# Patient Record
Sex: Female | Born: 1980 | Race: Asian | Hispanic: No | Marital: Single | Smoking: Never smoker
Health system: Southern US, Community
[De-identification: ages and names within clinical notes are randomized; demographics above are authoritative.]

---

## 2014-03-11 ENCOUNTER — Ambulatory Visit (INDEPENDENT_AMBULATORY_CARE_PROVIDER_SITE_OTHER): Payer: Self-pay | Admitting: Physician Assistant

## 2014-03-11 VITALS — BP 108/64 | HR 72 | Temp 98.1°F | Resp 14 | Ht 64.0 in | Wt 159.0 lb

## 2014-03-11 DIAGNOSIS — Z30011 Encounter for initial prescription of contraceptive pills: Secondary | ICD-10-CM

## 2014-03-11 DIAGNOSIS — Z3009 Encounter for other general counseling and advice on contraception: Secondary | ICD-10-CM

## 2014-03-11 LAB — POCT URINE PREGNANCY: Preg Test, Ur: NEGATIVE

## 2014-03-11 MED ORDER — NORGESTIMATE-ETH ESTRADIOL 0.25-35 MG-MCG PO TABS: 1.0000 | ORAL_TABLET | Freq: Every day | ORAL | Status: DC

## 2014-03-11 NOTE — Progress Notes (Signed)
   Subjective:    Patient ID: Megan Simpson, female    DOB: Mar 13, 1981, 33 y.o.   MRN: [id number]  HPI 33 year old female presents for consult of starting OCP's. She is here today with her husband who is translating for her.  They are interested in contraception - do not plan to become pregnant in at least 1 year. She has no concerns about STI's. Has never had a pap test. She is healthy with no other concerns today.  LNMP 02/09/14   Review of Systems  Constitutional: Negative for fever and chills.  Gastrointestinal: Negative for nausea and vomiting.  Genitourinary: Negative for vaginal discharge and menstrual problem.  Neurological: Negative for headaches.       Objective:   Physical Exam  Constitutional: She is oriented to person, place, and time. She appears well-developed and well-nourished.  HENT:  Head: Normocephalic and atraumatic.  Right Ear: External ear normal.  Left Ear: External ear normal.  Eyes: Conjunctivae are normal.  Neck: Normal range of motion.  Cardiovascular: Normal rate.   Pulmonary/Chest: Effort normal.  Neurological: She is alert and oriented to person, place, and time.  Psychiatric: She has a normal mood and affect. Her behavior is normal. Judgment and thought content normal.    Results for orders placed in visit on 03/11/14  POCT URINE PREGNANCY      Result Value Ref Range   Preg Test, Ur Negative           Assessment & Plan:  Encounter for initial prescription of contraceptive pills - Plan: POCT urine pregnancy, norgestimate-ethinyl estradiol (ORTHO-CYCLEN,SPRINTEC,PREVIFEM) 0.25-35 MG-MCG tablet, norgestimate-ethinyl estradiol (ORTHO-CYCLEN,SPRINTEC,PREVIFEM) 0.25-35 MG-MCG tablet  Discussed with patient and husband who decided they would like to try OCP's. Will consider alternate methods (depo or Nexplanon) if unable to remember to take daily.  Patient declined pap today. Plan to schedule at 104 within the next 6 months.

## 2014-04-07 ENCOUNTER — Encounter: Payer: Self-pay | Admitting: Family Medicine

## 2014-04-22 NOTE — Progress Notes (Signed)
This encounter was created in error - please disregard.

## 2014-10-20 ENCOUNTER — Ambulatory Visit (INDEPENDENT_AMBULATORY_CARE_PROVIDER_SITE_OTHER): Payer: 59 | Admitting: Family Medicine

## 2014-10-20 VITALS — BP 130/80 | HR 97 | Temp 97.9°F | Resp 16 | Ht 64.5 in | Wt 159.0 lb

## 2014-10-20 DIAGNOSIS — R509 Fever, unspecified: Secondary | ICD-10-CM | POA: Diagnosis not present

## 2014-10-20 DIAGNOSIS — R52 Pain, unspecified: Secondary | ICD-10-CM | POA: Diagnosis not present

## 2014-10-20 DIAGNOSIS — R059 Cough, unspecified: Secondary | ICD-10-CM

## 2014-10-20 DIAGNOSIS — Z30011 Encounter for initial prescription of contraceptive pills: Secondary | ICD-10-CM

## 2014-10-20 DIAGNOSIS — R05 Cough: Secondary | ICD-10-CM

## 2014-10-20 DIAGNOSIS — J101 Influenza due to other identified influenza virus with other respiratory manifestations: Secondary | ICD-10-CM

## 2014-10-20 LAB — POCT CBC
Granulocyte percent: 67.5 %G (ref 37–80)
HCT, POC: 40.3 % (ref 37.7–47.9)
Hemoglobin: 12.7 g/dL (ref 12.2–16.2)
Lymph, poc: 1.9 (ref 0.6–3.4)
MCH: 28.6 pg (ref 27–31.2)
MCHC: 31.6 g/dL — AB (ref 31.8–35.4)
MCV: 90.6 fL (ref 80–97)
MID (cbc): 0.7 (ref 0–0.9)
MPV: 7.8 fL (ref 0–99.8)
POC Granulocyte: 5.5 (ref 2–6.9)
POC LYMPH PERCENT: 23.4 %L (ref 10–50)
POC MID %: 9.1 % (ref 0–12)
Platelet Count, POC: 276 10*3/uL (ref 142–424)
RBC: 4.45 M/uL (ref 4.04–5.48)
RDW, POC: 12.8 %
WBC: 8.1 10*3/uL (ref 4.6–10.2)

## 2014-10-20 LAB — POCT INFLUENZA A/B
Influenza A, POC: POSITIVE
Influenza B, POC: NEGATIVE

## 2014-10-20 MED ORDER — HYDROCODONE-HOMATROPINE 5-1.5 MG/5ML PO SYRP: 5.0000 mL | ORAL_SOLUTION | Freq: Three times a day (TID) | ORAL | Status: DC | PRN

## 2014-10-20 MED ORDER — NORGESTIMATE-ETH ESTRADIOL 0.25-35 MG-MCG PO TABS: 1.0000 | ORAL_TABLET | Freq: Every day | ORAL | Status: DC

## 2014-10-20 NOTE — Patient Instructions (Signed)
You have the flu- this is why you are feeling so badly.   Continue to use OTC tylenol or ibuprofen as needed for fever and aches The cough syrup will make you feel drowsy- do not take it when you need to drive Let me know if you do not feel better in the next few days

## 2014-10-20 NOTE — Progress Notes (Signed)
[facility] [address], [place] [id number] [phone]  Date:  10/20/2014   Name:  Kandis BanYen Vaca   DOB:  [birth year]   MRN:  [id number]  PCP:  No PCP Per Patient    Chief Complaint: Nasal Congestion; Dizziness; and Fever   History of Present Illness:  Kandis BanYen Canterbury is a 34 y.o. very pleasant female patient who presents with the following:  She is here today with fever,runny nose, dizzy.  This started yesterday.  She is generally in good health She also has a cough that started a few days prior to this illness She had diarrhea last night, she notes sore throat No vomiting  At home they have tried tylenol, dayquil; her last dose was this am She is generally in good health   She has her menses now.   There are no active problems to display for this patient.   History reviewed. No pertinent past medical history.  History reviewed. No pertinent past surgical history.  History  Substance Use Topics  . Smoking status: Never Smoker   . Smokeless tobacco: Not on file  . Alcohol Use: Not on file    History reviewed. No pertinent family history.  No Known Allergies  Medication list has been reviewed and updated.  Current Outpatient Prescriptions on File Prior to Visit  Medication Sig Dispense Refill  . norgestimate-ethinyl estradiol (ORTHO-CYCLEN,SPRINTEC,PREVIFEM) 0.25-35 MG-MCG tablet Take 1 tablet by mouth daily. 1 Package 5  . norgestimate-ethinyl estradiol (ORTHO-CYCLEN,SPRINTEC,PREVIFEM) 0.25-35 MG-MCG tablet Take 1 tablet by mouth daily. 1 Package 5   No current facility-administered medications on file prior to visit.    Review of Systems:  As per HPI- otherwise negative.   Physical Examination: Filed Vitals:   10/20/14 1542  BP: 130/80  Pulse: 97  Temp: 97.9 F (36.6 C)  Resp: 16   Filed Vitals:   10/20/14 1542  Height: 5' 4.5" (1.638 m)  Weight: 159 lb (72.122 kg)   Body mass index is 26.88 kg/(m^2). Ideal Body Weight: Weight in  (lb) to have BMI = 25: 147.6  GEN: WDWN, NAD, Non-toxic, A & O x 3, looks well, mild overweight HEENT: Atraumatic, Normocephalic. Neck supple. No masses, No LAD.  Bilateral TM wnl, oropharynx normal.  PEERL,EOMI.   Ears and Nose: No external deformity. CV: RRR, No M/G/R. No JVD. No thrill. No extra heart sounds. PULM: CTA B, no wheezes, crackles, rhonchi. No retractions. No resp. distress. No accessory muscle use. ABD: S, NT, ND, +BS. No rebound. No HSM.  Benign exam EXTR: No c/c/e NEURO Normal gait.  PSYCH: Normally interactive. Conversant. Not depressed or anxious appearing.  Calm demeanor.   Results for orders placed or performed in visit on 10/20/14  POCT CBC  Result Value Ref Range   WBC 8.1 4.6 - 10.2 K/uL   Lymph, poc 1.9 0.6 - 3.4   POC LYMPH PERCENT 23.4 10 - 50 %L   MID (cbc) 0.7 0 - 0.9   POC MID % 9.1 0 - 12 %M   POC Granulocyte 5.5 2 - 6.9   Granulocyte percent 67.5 37 - 80 %G   RBC 4.45 4.04 - 5.48 M/uL   Hemoglobin 12.7 12.2 - 16.2 g/dL   HCT, POC 29.540.3 62.137.7 - 47.9 %   MCV 90.6 80 - 97 fL   MCH, POC 28.6 27 - 31.2 pg   MCHC 31.6 (A) 31.8 - 35.4 g/dL   RDW, POC 30.812.8 %   Platelet Count, POC  276 142 - 424 K/uL   MPV 7.8 0 - 99.8 fL  POCT Influenza A/B  Result Value Ref Range   Influenza A, POC Positive    Influenza B, POC Negative      Assessment and Plan: Fever, unknown origin - Plan: POCT CBC, POCT Influenza A/B  Body aches - Plan: POCT CBC, POCT Influenza A/B  Cough - Plan: POCT CBC, POCT Influenza A/B, HYDROcodone-homatropine (HYCODAN) 5-1.5 MG/5ML syrup  Influenza A  Positive for flu a.  She has been sick for 4-5 days so too late for tamiflu.  Hycodan as needed, supportive measures. She will let us know if not better soon  Signed Abbe Amsterdam, MD

## 2014-11-12 ENCOUNTER — Ambulatory Visit (INDEPENDENT_AMBULATORY_CARE_PROVIDER_SITE_OTHER): Payer: 59 | Admitting: Family Medicine

## 2014-11-12 ENCOUNTER — Ambulatory Visit (INDEPENDENT_AMBULATORY_CARE_PROVIDER_SITE_OTHER): Payer: 59

## 2014-11-12 VITALS — BP 122/80 | HR 79 | Temp 97.9°F | Resp 18 | Ht 63.75 in | Wt 154.0 lb

## 2014-11-12 DIAGNOSIS — S91104A Unspecified open wound of right lesser toe(s) without damage to nail, initial encounter: Secondary | ICD-10-CM | POA: Diagnosis not present

## 2014-11-12 DIAGNOSIS — Z23 Encounter for immunization: Secondary | ICD-10-CM

## 2014-11-12 MED ORDER — CEPHALEXIN 500 MG PO CAPS: 500.0000 mg | ORAL_CAPSULE | Freq: Three times a day (TID) | ORAL | Status: DC

## 2014-11-12 NOTE — Patient Instructions (Signed)
Keep the wound clean with a Band-Aid on it. Wash it at least once or twice every day.  Take the cephalexin antibiotic one pill 3 times daily in the morning, afternoon, and evening.  Return if it ever is looking worse with more redness or a red line up your foot.  If not much better in 7-10 days come back for a recheck.

## 2014-11-12 NOTE — Progress Notes (Signed)
Subjective: Patient was shaving her feet over a week ago and cut her right fifth toe. They had some amoxicillin from [place] that she has been taking. She has been keeping a Band-Aid on it. She did not see a physician at that time. She has not had an up-to-date tetanus shot in over a decade. Her last menstrual cycle was a couple weeks ago, uses birth control.  Objective: Right fifth toe distal joint has a 1 cm open wound with a thickened border around it. It is partially healed but has quite mucus down in the ulcer cavity. There is no erythema or ascending erythema.  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 Alwyn RenHopper Normal xray.  No bony destruction.  Doubt osteomyelitis. I believe this is just a ulcer created by the wound. Will treat with cephalexin. Recheck if not much better over the next week.

## 2014-11-15 LAB — WOUND CULTURE
GRAM STAIN: NONE SEEN
Gram Stain: NONE SEEN
Gram Stain: NONE SEEN
Organism ID, Bacteria: NO GROWTH

## 2015-02-09 ENCOUNTER — Ambulatory Visit (INDEPENDENT_AMBULATORY_CARE_PROVIDER_SITE_OTHER): Payer: 59 | Admitting: Family Medicine

## 2015-02-09 ENCOUNTER — Ambulatory Visit (INDEPENDENT_AMBULATORY_CARE_PROVIDER_SITE_OTHER): Payer: 59

## 2015-02-09 VITALS — BP 112/80 | HR 81 | Temp 98.3°F | Resp 15 | Ht 63.75 in | Wt 154.0 lb

## 2015-02-09 DIAGNOSIS — Z30011 Encounter for initial prescription of contraceptive pills: Secondary | ICD-10-CM | POA: Diagnosis not present

## 2015-02-09 DIAGNOSIS — M25512 Pain in left shoulder: Secondary | ICD-10-CM

## 2015-02-09 DIAGNOSIS — S46812A Strain of other muscles, fascia and tendons at shoulder and upper arm level, left arm, initial encounter: Secondary | ICD-10-CM | POA: Diagnosis not present

## 2015-02-09 DIAGNOSIS — Z13228 Encounter for screening for other metabolic disorders: Secondary | ICD-10-CM | POA: Diagnosis not present

## 2015-02-09 DIAGNOSIS — Z1329 Encounter for screening for other suspected endocrine disorder: Secondary | ICD-10-CM

## 2015-02-09 DIAGNOSIS — Z13220 Encounter for screening for lipoid disorders: Secondary | ICD-10-CM | POA: Diagnosis not present

## 2015-02-09 LAB — LIPID PANEL
CHOLESTEROL: 235 mg/dL — AB (ref 0–200)
HDL: 50 mg/dL (ref >=46)
LDL Cholesterol: 119 mg/dL — ABNORMAL HIGH (ref 0–99)
TRIGLYCERIDES: 331 mg/dL — AB (ref <150)
Total CHOL/HDL Ratio: 4.7 Ratio
VLDL: 66 mg/dL — ABNORMAL HIGH (ref 0–40)

## 2015-02-09 LAB — COMPLETE METABOLIC PANEL WITH GFR
ALT: 16 U/L (ref 0–35)
AST: 17 U/L (ref 0–37)
Albumin: 4.3 g/dL (ref 3.5–5.2)
Alkaline Phosphatase: 47 U/L (ref 39–117)
BILIRUBIN TOTAL: 0.6 mg/dL (ref 0.2–1.2)
BUN: 9 mg/dL (ref 6–23)
CO2: 24 mEq/L (ref 19–32)
Calcium: 9.9 mg/dL (ref 8.4–10.5)
Chloride: 102 mEq/L (ref 96–112)
Creat: 0.65 mg/dL (ref 0.50–1.10)
GFR, Est African American: >89 mL/min
GFR, Est Non African American: >89 mL/min
Glucose, Bld: 89 mg/dL (ref 70–99)
POTASSIUM: 4.4 meq/L (ref 3.5–5.3)
SODIUM: 139 meq/L (ref 135–145)
Total Protein: 7.4 g/dL (ref 6.0–8.3)

## 2015-02-09 LAB — POCT CBC
Granulocyte percent: 64.1 %G (ref 37–80)
HCT, POC: 42 % (ref 37.7–47.9)
HEMOGLOBIN: 14 g/dL (ref 12.2–16.2)
LYMPH, POC: 2.3 (ref 0.6–3.4)
MCH, POC: 28.4 pg (ref 27–31.2)
MCHC: 33.3 g/dL (ref 31.8–35.4)
MCV: 85.5 fL (ref 80–97)
MID (cbc): 0.6 (ref 0–0.9)
MPV: 8.2 fL (ref 0–99.8)
POC Granulocyte: 5.2 (ref 2–6.9)
POC LYMPH PERCENT: 28.8 %L (ref 10–50)
POC MID %: 7.1 %M (ref 0–12)
Platelet Count, POC: 358 10*3/uL (ref 142–424)
RBC: 4.91 M/uL (ref 4.04–5.48)
RDW, POC: 12.5 %
WBC: 8.1 10*3/uL (ref 4.6–10.2)

## 2015-02-09 LAB — TSH: TSH: 0.758 u[IU]/mL (ref 0.350–4.500)

## 2015-02-09 MED ORDER — NORGESTIMATE-ETH ESTRADIOL 0.25-35 MG-MCG PO TABS: 1.0000 | ORAL_TABLET | Freq: Every day | ORAL | Status: AC

## 2015-02-09 MED ORDER — CYCLOBENZAPRINE HCL 10 MG PO TABS: 10.0000 mg | ORAL_TABLET | Freq: Every day | ORAL | Status: AC

## 2015-02-09 MED ORDER — MELOXICAM 15 MG PO TABS: 15.0000 mg | ORAL_TABLET | Freq: Every day | ORAL | Status: AC

## 2015-02-09 NOTE — Patient Instructions (Signed)
Please ice the painful areas 3-4x per day for 15 minutes.  Make sure there is a barrier between your body and the ice.  Please take the mobic once per day.  Do not take with ibuprofen or naproxen.  Please let me know if your neck pain does not improve. The mobic is also for this clavicle pain.  I want to check in with you in 10 days, to see if this has improved.  Otherwise we need to do more imaging Please schedule with me at the primary care next door (104 office address), or you can return to the urgent care area at the 102 office here ([address] office address).  You can ice this area the same way (3-4x per day for 15 minutes).  Return sooner if it worsens. I will have your lab results within the next 14 days.     Cervical Strain with Rehab Cervical strain and sprain are injuries that commonly occur with "whiplash" injuries. Whiplash occurs when the neck is forcefully whipped backward or forward, such as during a motor vehicle accident or during contact sports. The muscles, ligaments, tendons, discs, and nerves of the neck are susceptible to injury when this occurs. RISK FACTORS Risk of having a whiplash injury increases if:  Osteoarthritis of the spine.  Situations that make head or neck accidents or trauma more likely.  High-risk sports (football, rugby, wrestling, hockey, auto racing, gymnastics, diving, contact karate, or boxing).  Poor strength and flexibility of the neck.  Previous neck injury.  Poor tackling technique.  Improperly fitted or padded equipment. SYMPTOMS   Pain or stiffness in the front or back of neck or both.  Symptoms may present immediately or up to 24 hours after injury.  Dizziness, headache, nausea, and vomiting.  Muscle spasm with soreness and stiffness in the neck.  Tenderness and swelling at the injury site. PREVENTION  Learn and use proper technique (avoid tackling with the head, spearing, and head-butting; use proper falling techniques to avoid landing  on the head).  Warm up and stretch properly before activity.  Maintain physical fitness:  Strength, flexibility, and endurance.  Cardiovascular fitness.  Wear properly fitted and padded protective equipment, such as padded soft collars, for participation in contact sports. PROGNOSIS  Recovery from cervical strain and sprain injuries is dependent on the extent of the injury. These injuries are usually curable in 1 week to 3 months with appropriate treatment.  RELATED COMPLICATIONS   Temporary numbness and weakness may occur if the nerve roots are damaged, and this may persist until the nerve has completely healed.  Chronic pain due to frequent recurrence of symptoms.  Prolonged healing, especially if activity is resumed too soon (before complete recovery). TREATMENT  Treatment initially involves the use of ice and medication to help reduce pain and inflammation. It is also important to perform strengthening and stretching exercises and modify activities that worsen symptoms so the injury does not get worse. These exercises may be performed at home or with a therapist. For patients who experience severe symptoms, a soft, padded collar may be recommended to be worn around the neck.  Improving your posture may help reduce symptoms. Posture improvement includes pulling your chin and abdomen in while sitting or standing. If you are sitting, sit in a firm chair with your buttocks against the back of the chair. While sleeping, try replacing your pillow with a small towel rolled to 2 inches in diameter, or use a cervical pillow or soft cervical collar. Poor  sleeping positions delay healing.  For patients with nerve root damage, which causes numbness or weakness, the use of a cervical traction apparatus may be recommended. Surgery is rarely necessary for these injuries. However, cervical strain and sprains that are present at birth (congenital) may require surgery. MEDICATION   If pain medication is  necessary, nonsteroidal anti-inflammatory medications, such as aspirin and ibuprofen, or other minor pain relievers, such as acetaminophen, are often recommended.  Do not take pain medication for 7 days before surgery.  Prescription pain relievers may be given if deemed necessary by your caregiver. Use only as directed and only as much as you need. HEAT AND COLD:   Cold treatment (icing) relieves pain and reduces inflammation. Cold treatment should be applied for 10 to 15 minutes every 2 to 3 hours for inflammation and pain and immediately after any activity that aggravates your symptoms. Use ice packs or an ice massage.  Heat treatment may be used prior to performing the stretching and strengthening activities prescribed by your caregiver, physical therapist, or athletic trainer. Use a heat pack or a warm soak. SEEK MEDICAL CARE IF:   Symptoms get worse or do not improve in 2 weeks despite treatment.  New, unexplained symptoms develop (drugs used in treatment may produce side effects). EXERCISES RANGE OF MOTION (ROM) AND STRETCHING EXERCISES - Cervical Strain and Sprain These exercises may help you when beginning to rehabilitate your injury. In order to successfully resolve your symptoms, you must improve your posture. These exercises are designed to help reduce the forward-head and rounded-shoulder posture which contributes to this condition. Your symptoms may resolve with or without further involvement from your physician, physical therapist or athletic trainer. While completing these exercises, remember:   Restoring tissue flexibility helps normal motion to return to the joints. This allows healthier, less painful movement and activity.  An effective stretch should be held for at least 20 seconds, although you may need to begin with shorter hold times for comfort.  A stretch should never be painful. You should only feel a gentle lengthening or release in the stretched tissue. STRETCH-  Axial Extensors  Lie on your back on the floor. You may bend your knees for comfort. Place a rolled-up hand towel or dish towel, about 2 inches in diameter, under the part of your head that makes contact with the floor.  Gently tuck your chin, as if trying to make a "double chin," until you feel a gentle stretch at the base of your head.  Hold __________ seconds. Repeat __________ times. Complete this exercise __________ times per day.  STRETCH - Axial Extension   Stand or sit on a firm surface. Assume a good posture: chest up, shoulders drawn back, abdominal muscles slightly tense, knees unlocked (if standing) and feet hip width apart.  Slowly retract your chin so your head slides back and your chin slightly lowers. Continue to look straight ahead.  You should feel a gentle stretch in the back of your head. Be certain not to feel an aggressive stretch since this can cause headaches later.  Hold for __________ seconds. Repeat __________ times. Complete this exercise __________ times per day. STRETCH - Cervical Side Bend   Stand or sit on a firm surface. Assume a good posture: chest up, shoulders drawn back, abdominal muscles slightly tense, knees unlocked (if standing) and feet hip width apart.  Without letting your nose or shoulders move, slowly tip your right / left ear to your shoulder until your feel a gentle  stretch in the muscles on the opposite side of your neck.  Hold __________ seconds. Repeat __________ times. Complete this exercise __________ times per day. STRETCH - Cervical Rotators   Stand or sit on a firm surface. Assume a good posture: chest up, shoulders drawn back, abdominal muscles slightly tense, knees unlocked (if standing) and feet hip width apart.  Keeping your eyes level with the ground, slowly turn your head until you feel a gentle stretch along the back and opposite side of your neck.  Hold __________ seconds. Repeat __________ times. Complete this exercise  __________ times per day. RANGE OF MOTION - Neck Circles   Stand or sit on a firm surface. Assume a good posture: chest up, shoulders drawn back, abdominal muscles slightly tense, knees unlocked (if standing) and feet hip width apart.  Gently roll your head down and around from the back of one shoulder to the back of the other. The motion should never be forced or painful.  Repeat the motion 10-20 times, or until you feel the neck muscles relax and loosen. Repeat __________ times. Complete the exercise __________ times per day. STRENGTHENING EXERCISES - Cervical Strain and Sprain These exercises may help you when beginning to rehabilitate your injury. They may resolve your symptoms with or without further involvement from your physician, physical therapist, or athletic trainer. While completing these exercises, remember:   Muscles can gain both the endurance and the strength needed for everyday activities through controlled exercises.  Complete these exercises as instructed by your physician, physical therapist, or athletic trainer. Progress the resistance and repetitions only as guided.  You may experience muscle soreness or fatigue, but the pain or discomfort you are trying to eliminate should never worsen during these exercises. If this pain does worsen, stop and make certain you are following the directions exactly. If the pain is still present after adjustments, discontinue the exercise until you can discuss the trouble with your clinician. STRENGTH - Cervical Flexors, Isometric  Face a wall, standing about 6 inches away. Place a small pillow, a ball about 6-8 inches in diameter, or a folded towel between your forehead and the wall.  Slightly tuck your chin and gently push your forehead into the soft object. Push only with mild to moderate intensity, building up tension gradually. Keep your jaw and forehead relaxed.  Hold 10 to 20 seconds. Keep your breathing relaxed.  Release the  tension slowly. Relax your neck muscles completely before you start the next repetition. Repeat __________ times. Complete this exercise __________ times per day. STRENGTH- Cervical Lateral Flexors, Isometric   Stand about 6 inches away from a wall. Place a small pillow, a ball about 6-8 inches in diameter, or a folded towel between the side of your head and the wall.  Slightly tuck your chin and gently tilt your head into the soft object. Push only with mild to moderate intensity, building up tension gradually. Keep your jaw and forehead relaxed.  Hold 10 to 20 seconds. Keep your breathing relaxed.  Release the tension slowly. Relax your neck muscles completely before you start the next repetition. Repeat __________ times. Complete this exercise __________ times per day. STRENGTH - Cervical Extensors, Isometric   Stand about 6 inches away from a wall. Place a small pillow, a ball about 6-8 inches in diameter, or a folded towel between the back of your head and the wall.  Slightly tuck your chin and gently tilt your head back into the soft object. Push only with mild  to moderate intensity, building up tension gradually. Keep your jaw and forehead relaxed.  Hold 10 to 20 seconds. Keep your breathing relaxed.  Release the tension slowly. Relax your neck muscles completely before you start the next repetition. Repeat __________ times. Complete this exercise __________ times per day. POSTURE AND BODY MECHANICS CONSIDERATIONS - Cervical Strain and Sprain Keeping correct posture when sitting, standing or completing your activities will reduce the stress put on different body tissues, allowing injured tissues a chance to heal and limiting painful experiences. The following are general guidelines for improved posture. Your physician or physical therapist will provide you with any instructions specific to your needs. While reading these guidelines, remember:  The exercises prescribed by your provider  will help you have the flexibility and strength to maintain correct postures.  The correct posture provides the optimal environment for your joints to work. All of your joints have less wear and tear when properly supported by a spine with good posture. This means you will experience a healthier, less painful body.  Correct posture must be practiced with all of your activities, especially prolonged sitting and standing. Correct posture is as important when doing repetitive low-stress activities (typing) as it is when doing a single heavy-load activity (lifting). PROLONGED STANDING WHILE SLIGHTLY LEANING FORWARD When completing a task that requires you to lean forward while standing in one place for a long time, place either foot up on a stationary 2- to 4-inch high object to help maintain the best posture. When both feet are on the ground, the low back tends to lose its slight inward curve. If this curve flattens (or becomes too large), then the back and your other joints will experience too much stress, fatigue more quickly, and can cause pain.  RESTING POSITIONS Consider which positions are most painful for you when choosing a resting position. If you have pain with flexion-based activities (sitting, bending, stooping, squatting), choose a position that allows you to rest in a less flexed posture. You would want to avoid curling into a fetal position on your side. If your pain worsens with extension-based activities (prolonged standing, working overhead), avoid resting in an extended position such as sleeping on your stomach. Most people will find more comfort when they rest with their spine in a more neutral position, neither too rounded nor too arched. Lying on a non-sagging bed on your side with a pillow between your knees, or on your back with a pillow under your knees will often provide some relief. Keep in mind, being in any one position for a prolonged period of time, no matter how correct your  posture, can still lead to stiffness. WALKING Walk with an upright posture. Your ears, shoulders, and hips should all line up. OFFICE WORK When working at a desk, create an environment that supports good, upright posture. Without extra support, muscles fatigue and lead to excessive strain on joints and other tissues. CHAIR:  A chair should be able to slide under your desk when your back makes contact with the back of the chair. This allows you to work closely.  The chair's height should allow your eyes to be level with the upper part of your monitor and your hands to be slightly lower than your elbows.  Body position:  Your feet should make contact with the floor. If this is not possible, use a foot rest.  Keep your ears over your shoulders. This will reduce stress on your neck and low back. Document Released: 07/25/2005 Document  Revised: 12/09/2013 Document Reviewed: 11/06/2008 [employer] Patient Information 2015 [place], [place]. This information is not intended to replace advice given to you by your health care provider. Make sure you discuss any questions you have with your health care provider.

## 2015-02-09 NOTE — Progress Notes (Signed)
[facility] [address], [place] [id number] [phone]  Date:  02/09/2015   Name:  Megan BanYen Aure   DOB:  [birth year]   MRN:  [id number]  PCP:  No PCP Per Patient    History of Present Illness:  Megan Simpson is a 34 y.o. female patient who [facility] for chief complaint of left shoulder pain for 4 months ago, and 5 days of left clavicle pain and swelling.   Left shoulder is painful intermittently for several months.  She states that she has pain with bending her head down and with laying down.  There is no known swelling.  She has had no trauma to the area.  She works as a Radio broadcast assistantnail tech and is constantly leaning forward and looking down.  Prior to 4 months ago, she went on a long vacation back to [place], and noticed the pain occurred after returning.  She has no numbness or tingling, or weakness to left extremity. She also complains of atraumatic clavicle pain and swelling for 5 days.  She noticed that the pain is significant to touch and if she lays on her left side.  She has not tried any thing for relief.  She has no fever, night sweats, cough, or weight loss.  She denies any dizziness.    She is also requesting labs for cholesterol, kidney and liver function.  She has not had a physical in years.  She has never had a pap smear.  She denies any chest pain, palpitations, leg swelling, sob, or dizziness.  She is also requesting birth control medication.  She has 3 months supply, but would like to have refills added.    There are no active problems to display for this patient.   No past medical history on file.  No past surgical history on file.  History  Substance Use Topics  . Smoking status: Never Smoker   . Smokeless tobacco: Not on file  . Alcohol Use: Not on file    No family history on file.  No Known Allergies  Medication list has been reviewed and updated.  Current Outpatient Prescriptions on File Prior to Visit  Medication Sig Dispense Refill  .  norgestimate-ethinyl estradiol (ORTHO-CYCLEN,SPRINTEC,PREVIFEM) 0.25-35 MG-MCG tablet Take 1 tablet by mouth daily. 1 Package 5   No current facility-administered medications on file prior to visit.    ROS ROS otherwise unremarkable unless listed above.    Physical Examination: BP 112/80 mmHg  Pulse 81  Temp(Src) 98.3 F (36.8 C) (Oral)  Resp 15  Ht 5' 3.75" (1.619 m)  Wt 154 lb (69.854 kg)  BMI 26.65 kg/m2  SpO2 99%  LMP 02/03/2015 Ideal Body Weight: Weight in (lb) to have BMI = 25: 144.2  Physical Exam  Constitutional: She is oriented to person, place, and time. She appears well-developed and well-nourished. No distress.  HENT:  Head: Normocephalic and atraumatic.  Eyes: EOM are normal. Pupils are equal, round, and reactive to light.  Neck: Normal range of motion. No tracheal deviation present. No thyromegaly present.  Cardiovascular: Normal rate, regular rhythm, normal heart sounds and intact distal pulses.  Exam reveals no gallop and no friction rub.   No murmur heard. Pulmonary/Chest: Effort normal and breath sounds normal. No respiratory distress. She has no wheezes.  Musculoskeletal: She exhibits no edema or tenderness.       Left shoulder: She exhibits normal range of motion and no bony tenderness.  No cervical spinous tenderness.  Trapezius  tenderness with muscle spasm on left left side just above scapula.  Neck extension incites pain to the clavicle.  No tenderness along the shoulder.  External houlder rom incites clavicle pain though normal rom here and in all planes.  No clavicular crepitus.  Proximal tenderness at sternal end.  There is some swelling along this area.  No warmth.  Possible erythema secondary to her constant touching.  Distal radial pulse normal.    Lymphadenopathy:    She has no cervical adenopathy.       Right: No supraclavicular adenopathy present.       Left: No supraclavicular adenopathy present.  Neurological: She is alert and oriented to person,  place, and time.  Skin: Skin is warm and dry. She is not diaphoretic.  Psychiatric: She has a normal mood and affect. Her behavior is normal.    Results for orders placed or performed in visit on 02/09/15  POCT CBC  Result Value Ref Range   WBC 8.1 4.6 - 10.2 K/uL   Lymph, poc 2.3 0.6 - 3.4   POC LYMPH PERCENT 28.8 10 - 50 %L   MID (cbc) 0.6 0 - 0.9   POC MID % 7.1 0 - 12 %M   POC Granulocyte 5.2 2 - 6.9   Granulocyte percent 64.1 37 - 80 %G   RBC 4.91 4.04 - 5.48 M/uL   Hemoglobin 14.0 12.2 - 16.2 g/dL   HCT, POC 16.1 09.6 - 47.9 %   MCV 85.5 80 - 97 fL   MCH, POC 28.4 27 - 31.2 pg   MCHC 33.3 31.8 - 35.4 g/dL   RDW, POC 04.5 %   Platelet Count, POC 358 142 - 424 K/uL   MPV 8.2 0 - 99.8 fL   [facility] reading (PRIMARY) by  Dr. Katrinka Blazing: Negative  Assessment and Plan: 34 year old female is here today for chief complaint of left shoulder and clavicle pain.  These appear to be two separate sxs.   -ice the 3-4x per day for 15 minutes.   -Mobic once per day.  Advised not take with ibuprofen or naproxen.  Please let me know if your neck pain does not improve. -Flexeril given for night only, for the muscle spasm.  I have also given him neck exercises to do both verbally and written handout.  -I have also advised her to schedule a physical exam where labs could also be obtained.  She needs to have a pap smear and other labs at this time.  She declines the pap smear.  I will order labs per her request. -Advised to return in 10 days, but [place].  I have advised her to rtc in 14 days when she returns, or to follow up with an urgent care or pcp  1. Clavicle pain, left - POCT CBC - DG Clavicle Left - meloxicam (MOBIC) 15 MG tablet; Take 1 tablet (15 mg total) by mouth daily.  Dispense: 30 tablet; Refill: 1  2. Trapezius strain, left, initial encounter - meloxicam (MOBIC) 15 MG tablet; Take 1 tablet (15 mg total) by mouth daily.  Dispense: 30 tablet; Refill: 1 -  cyclobenzaprine (FLEXERIL) 10 MG tablet; Take 1 tablet (10 mg total) by mouth at bedtime.  Dispense: 20 tablet; Refill: 0  3. Encounter for initial prescription of contraceptive pills - norgestimate-ethinyl estradiol (ORTHO-CYCLEN,SPRINTEC,PREVIFEM) 0.25-35 MG-MCG tablet; Take 1 tablet by mouth daily.  Dispense: 1 Package; Refill: 5  4. Screening for lipid disorders - Lipid  panel  5. Screening for metabolic disorder - COMPLETE METABOLIC PANEL WITH GFR  6. Screening for thyroid disorder - TSH    Trena Platt, PA-C [facility] 02/09/2015 12:09 PM

## 2015-02-12 NOTE — Progress Notes (Signed)
History and physical examinations reviewed with PA English.  Xray reviewed during visit. Agree with assessment and plan. Karleigh Bunte Paulita FujitaMartin Asharia Lotter, M.D. [facility]  [place] [address] [place], [place]  [id number] [phone] phone [phone] fax

## 2015-02-16 ENCOUNTER — Encounter: Payer: Self-pay | Admitting: Family Medicine

## 2015-04-14 ENCOUNTER — Ambulatory Visit (INDEPENDENT_AMBULATORY_CARE_PROVIDER_SITE_OTHER): Payer: 59 | Admitting: Family Medicine

## 2015-04-14 VITALS — BP 112/62 | HR 87 | Temp 98.1°F | Resp 20 | Ht 64.25 in | Wt 154.0 lb

## 2015-04-14 DIAGNOSIS — N898 Other specified noninflammatory disorders of vagina: Secondary | ICD-10-CM

## 2015-04-14 DIAGNOSIS — Z124 Encounter for screening for malignant neoplasm of cervix: Secondary | ICD-10-CM

## 2015-04-14 DIAGNOSIS — M25512 Pain in left shoulder: Secondary | ICD-10-CM | POA: Diagnosis not present

## 2015-04-14 LAB — POCT WET PREP WITH KOH
CLUE CELLS WET PREP PER HPF POC: NEGATIVE
KOH Prep POC: NEGATIVE
RBC WET PREP PER HPF POC: NEGATIVE
Trichomonas, UA: NEGATIVE
YEAST WET PREP PER HPF POC: NEGATIVE

## 2015-04-14 NOTE — Progress Notes (Signed)
[facility] [address] [place] [address] [phone]  Date:  04/14/2015   Name:  Megan Simpson   DOB:  02-11-1981   MRN:  [id number]  PCP:  No PCP Per Patient    Chief Complaint: Clavicle Injury and Vaginal Discharge   History of Present Illness:  Megan Simpson is a 34 y.o. very pleasant female patient who presents with the following:  Here today to recheck left clavicle pain.  She was seen here for the same back in July; advised to use mobic, flexeril as needed. However she notes that the area still hurts some of the time, and sometimes will appear larger- the extend of perceived ?swelling will wax and wane rapidly.    X-ray from that visit:  LEFT CLAVICLE - 2+ VIEWS  COMPARISON: None.  FINDINGS: There is no evidence of fracture or other focal bone lesions. Soft tissues are unremarkable.  IMPRESSION: Normal  She feels like the left clavicle still hurts "sometimes" and that sometimes it will appear larger than the other side.  The left shoulder however is better.    She needs to have a pap and also has noted some vaginal discharge with a bad odor.  She has noted this for about 3 weeks.  No itching or pain.  The odor seems to occur after sex.  She wonders if this is due to not eating enough vegetables She is on OCP currently LMP 8/25  There are no active problems to display for this patient.   History reviewed. No pertinent past medical history.  History reviewed. No pertinent past surgical history.  Social History  Substance Use Topics  . Smoking status: Never Smoker   . Smokeless tobacco: None  . Alcohol Use: None    History reviewed. No pertinent family history.  No Known Allergies  Medication list has been reviewed and updated.  Current Outpatient Prescriptions on File Prior to Visit  Medication Sig Dispense Refill  . cetirizine (ZYRTEC) 10 MG tablet Take 10 mg by mouth daily.    . cyclobenzaprine (FLEXERIL) 10 MG tablet Take 1  tablet (10 mg total) by mouth at bedtime. 20 tablet 0  . meloxicam (MOBIC) 15 MG tablet Take 1 tablet (15 mg total) by mouth daily. 30 tablet 1  . norgestimate-ethinyl estradiol (ORTHO-CYCLEN,SPRINTEC,PREVIFEM) 0.25-35 MG-MCG tablet Take 1 tablet by mouth daily. 1 Package 5   No current facility-administered medications on file prior to visit.    Review of Systems:  As per HPI- otherwise negative.   Physical Examination: Filed Vitals:   04/14/15 1237  BP: 112/62  Pulse: 87  Temp: 98.1 F (36.7 C)  Resp: 20   Filed Vitals:   04/14/15 1237  Height: 5' 4.25" (1.632 m)  Weight: 154 lb (69.854 kg)   Body mass index is 26.23 kg/(m^2). Ideal Body Weight: Weight in (lb) to have BMI = 25: 146.5  GEN: WDWN, NAD, Non-toxic, A & O x 3, looks well HEENT: Atraumatic, Normocephalic. Neck supple. No masses, No LAD. Ears and Nose: No external deformity. CV: RRR, No M/G/R. No JVD. No thrill. No extra heart sounds. PULM: CTA B, no wheezes, crackles, rhonchi. No retractions. No resp. distress. No accessory muscle use. EXTR: No c/c/e NEURO Normal gait.  PSYCH: Normally interactive. Conversant. Not depressed or anxious appearing.  Calm demeanor.  Her clavicles appear normal- the left clavicle head is a bit more prominent but this seems to be within normal range to me.  No redness, swelling,  or mass palpated Pelvic: normal, no vaginal lesions or discharge. Uterus normal, no CMT, no adnexal tendereness or masses No odor noted  Results for orders placed or performed in visit on 04/14/15  POCT Wet Prep with KOH  Result Value Ref Range   Trichomonas, UA Negative    Clue Cells Wet Prep HPF POC neg    Epithelial Wet Prep HPF POC Few Few, Moderate, Many   Yeast Wet Prep HPF POC neg    Bacteria Wet Prep HPF POC Few None, Few   RBC Wet Prep HPF POC neg    WBC Wet Prep HPF POC 0-1    KOH Prep POC Negative      Assessment and Plan: Clavicle pain, left - Plan: Ambulatory referral to Orthopedic  Surgery  Vaginal discharge - Plan: POCT Wet Prep with KOH  Screening for cervical cancer - Plan: Pap IG, CT/NG w/ reflex HPV when ASC-U  She continues to be concerned about her clavicle.  Have tried to reassure her but she would like to have further eval- referral to ortho Reassured that I do not see any disease state to explain her concern about vaginal odor Will be in touch with her pap  Signed Abbe Amsterdam, MD

## 2015-04-14 NOTE — Patient Instructions (Signed)
I do not see any cause of vaginal odor but will be in touch with your pap results asap We will refer you to a specialist to look at your collarbone concern. However I suspect that this is all ok

## 2015-04-16 ENCOUNTER — Encounter: Payer: Self-pay | Admitting: Family Medicine

## 2015-04-16 LAB — PAP IG, CT-NG, RFX HPV ASCU
CHLAMYDIA PROBE AMP: NEGATIVE
GC Probe Amp: NEGATIVE

## 2015-04-28 ENCOUNTER — Telehealth: Payer: Self-pay

## 2015-04-28 NOTE — Telephone Encounter (Signed)
Spoke with pt's husband, he is asking about a referral to the orthopedics. It looks like they contacted him. He states he called and there is no appt for him. I will call over to [facility] to see if they received his referral.

## 2015-04-28 NOTE — Telephone Encounter (Signed)
Patient's husband is calling because he states that he left a voicemail and still hasn't heard anything for a couple of weeks. He states that he needs to hear something today because his wife is in extreme pain and needs to see a specialist. Please call patient's husband Nien. [phone]

## 2015-04-29 NOTE — Telephone Encounter (Signed)
Do we need to re-send the referral documents to [facility], or did they receive them?

## 2015-04-29 NOTE — Telephone Encounter (Signed)
i believe Velna Hatchet did this today. Thanks.

## 2015-05-04 ENCOUNTER — Telehealth: Payer: Self-pay

## 2015-05-04 NOTE — Telephone Encounter (Signed)
Called pt, advised refill ready to pick up. I confirmed with the pharmacy.

## 2015-05-04 NOTE — Telephone Encounter (Signed)
Pt in need of her birth control pills, had a hard time explaining for her to call the pharmacy. Please call pt at [phone]      [facility] ON [facility] AND [place]

## 2016-09-11 IMAGING — CR DG CLAVICLE*L*
2 series · 2 of 2 positions shown · non-contrast
Comparison: None.

CLINICAL DATA: Left clavicular pain.  No trauma described.

EXAM:
LEFT CLAVICLE - 2+ VIEWS

[AP]
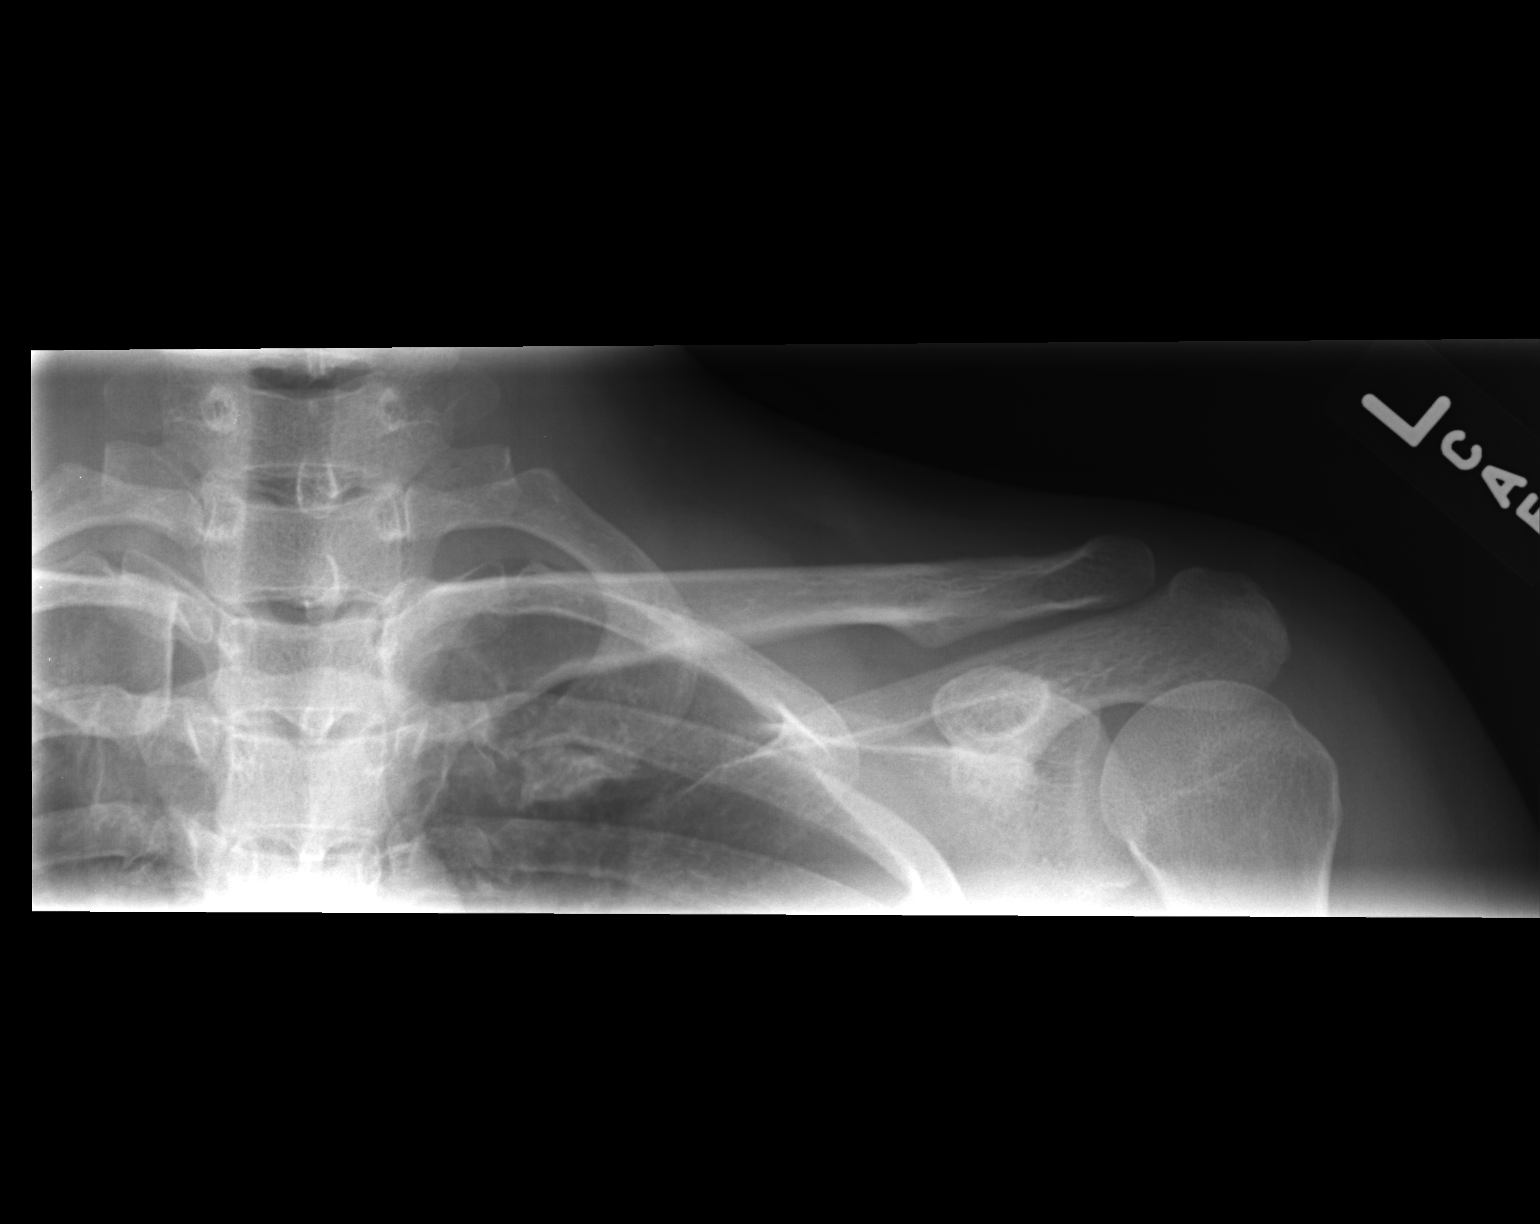

[ap axial]
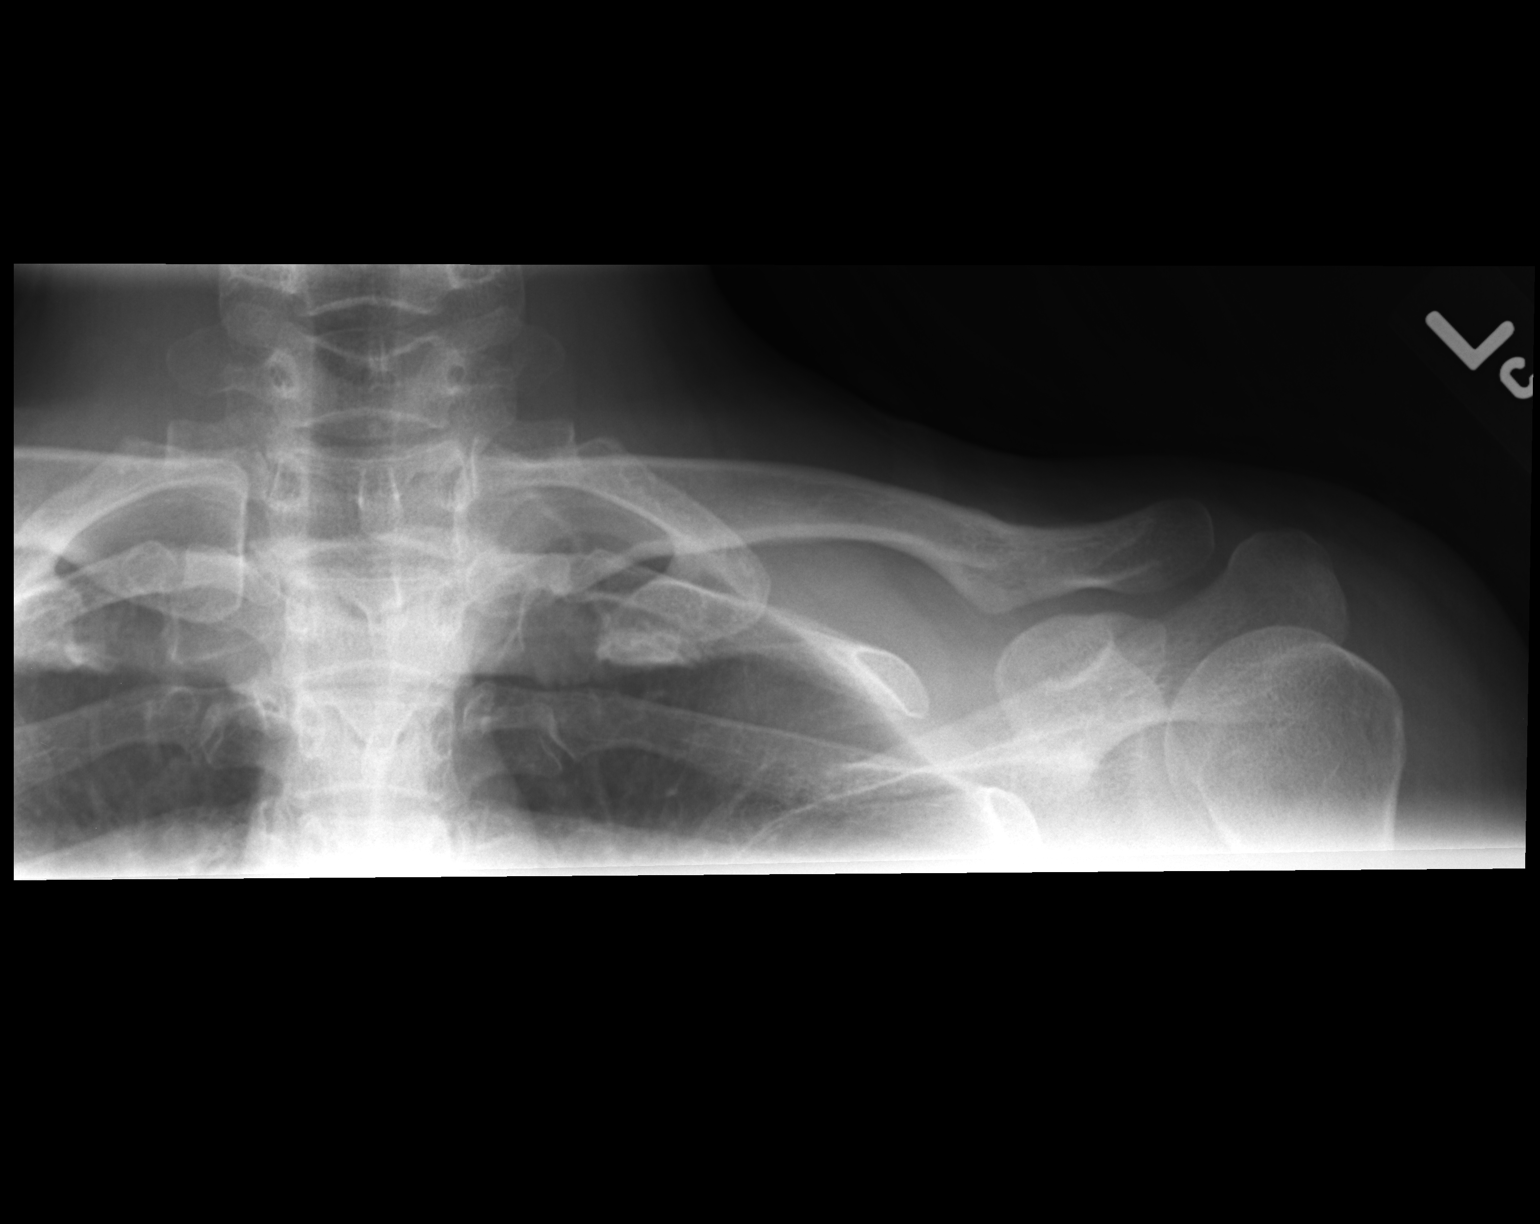

[2 of 2 positions shown; findings below may reference images not displayed]

FINDINGS: There is no evidence of fracture or other focal bone lesions. Soft
tissues are unremarkable.
IMPRESSION: Normal

## 2019-03-13 NOTE — Telephone Encounter (Signed)
Name of Caller: Tristine Langi Phone Number: [phone]    Reason for Appointment: New pt     Urgency of Appointment Request: low    Medication Refills need, if any:      Medication Name:

## 2019-03-21 NOTE — Telephone Encounter (Signed)
Pt scheduled

## 2019-03-22 NOTE — Telephone Encounter (Signed)
Name of Caller: Stacy Castillo phone number: verified     Relationship to Patient: patient    Provider: Katrine Coho     Practice:  Derm     Chief Complaint/Reason for Call: Patient called in stating she would like to know if there were any sooner appts available she has a new patient appt 9.1.20      Best time of day caller can be reached: AM       Patient advised that office/PCP has 24-48 business hours to return their call: Yes

## 2019-04-09 ENCOUNTER — Encounter: Payer: MEDICAID | Attending: Physician Assistant
